# Patient Record
Sex: Male | Born: 1970 | Race: Black or African American | Hispanic: No | Marital: Single | Smoking: Never smoker
Health system: Southern US, Community
[De-identification: ages and names within clinical notes are randomized; demographics above are authoritative.]

## PROBLEM LIST (undated history)

## (undated) DIAGNOSIS — F419 Anxiety disorder, unspecified: Secondary | ICD-10-CM

## (undated) DIAGNOSIS — G4730 Sleep apnea, unspecified: Secondary | ICD-10-CM

## (undated) HISTORY — PX: NECK SURGERY: SHX720

---

## 2000-02-20 ENCOUNTER — Encounter: Payer: Self-pay | Admitting: Emergency Medicine

## 2000-02-20 ENCOUNTER — Emergency Department (HOSPITAL_COMMUNITY): Admission: EM | Admit: 2000-02-20 | Discharge: 2000-02-20 | Payer: Self-pay | Admitting: Emergency Medicine

## 2008-05-13 ENCOUNTER — Emergency Department (HOSPITAL_BASED_OUTPATIENT_CLINIC_OR_DEPARTMENT_OTHER): Admission: EM | Admit: 2008-05-13 | Discharge: 2008-05-13 | Payer: Self-pay | Admitting: Emergency Medicine

## 2015-03-06 ENCOUNTER — Ambulatory Visit (INDEPENDENT_AMBULATORY_CARE_PROVIDER_SITE_OTHER): Payer: PRIVATE HEALTH INSURANCE

## 2015-03-06 ENCOUNTER — Other Ambulatory Visit: Payer: Self-pay | Admitting: Neurological Surgery

## 2015-03-06 DIAGNOSIS — M50222 Other cervical disc displacement at C5-C6 level: Secondary | ICD-10-CM

## 2015-03-06 DIAGNOSIS — M50323 Other cervical disc degeneration at C6-C7 level: Secondary | ICD-10-CM

## 2015-03-06 DIAGNOSIS — M50322 Other cervical disc degeneration at C5-C6 level: Secondary | ICD-10-CM | POA: Diagnosis not present

## 2015-03-06 DIAGNOSIS — Z9889 Other specified postprocedural states: Secondary | ICD-10-CM

## 2016-09-04 ENCOUNTER — Emergency Department (HOSPITAL_BASED_OUTPATIENT_CLINIC_OR_DEPARTMENT_OTHER)
Admission: EM | Admit: 2016-09-04 | Discharge: 2016-09-04 | Disposition: A | Discharge status: Home / Self Care | Payer: PRIVATE HEALTH INSURANCE | Attending: Emergency Medicine | Admitting: Emergency Medicine

## 2016-09-04 ENCOUNTER — Encounter (HOSPITAL_BASED_OUTPATIENT_CLINIC_OR_DEPARTMENT_OTHER): Payer: Self-pay | Admitting: *Deleted

## 2016-09-04 DIAGNOSIS — R61 Generalized hyperhidrosis: Secondary | ICD-10-CM | POA: Diagnosis not present

## 2016-09-04 DIAGNOSIS — G5701 Lesion of sciatic nerve, right lower limb: Secondary | ICD-10-CM | POA: Insufficient documentation

## 2016-09-04 DIAGNOSIS — M79604 Pain in right leg: Secondary | ICD-10-CM

## 2016-09-04 MED ORDER — LIDOCAINE 5 % EX PTCH: 1.0000 | MEDICATED_PATCH | CUTANEOUS | 0 refills | Status: AC

## 2016-09-04 MED ORDER — IBUPROFEN 600 MG PO TABS: 600.0000 mg | ORAL_TABLET | Freq: Four times a day (QID) | ORAL | 0 refills | Status: AC | PRN

## 2016-09-04 MED ORDER — KETOROLAC TROMETHAMINE 15 MG/ML IJ SOLN
15.0000 mg | Freq: Once | INTRAMUSCULAR | Status: DC
  Filled 2016-09-04: qty 1

## 2016-09-04 MED ORDER — KETOROLAC TROMETHAMINE 15 MG/ML IJ SOLN
15.0000 mg | Freq: Once | INTRAMUSCULAR | Status: AC
  Administered 2016-09-04: 15 mg via INTRAMUSCULAR

## 2016-09-04 NOTE — ED Provider Notes (Signed)
[facility] Provider [facility]   CSN: [id number] Arrival date & time: 09/04/16  1808   History   Chief Complaint Chief Complaint  Patient presents with  . Leg Pain    HPI Aaron Richardson is a 46 y.o. male.  HPI  Patient presents to ED with pain to his right lateral leg for 2 days. Patient describes the pain as achy and radiates down his leg to the back of his knee. Also is having some associated pain to his gluteus area. Associated tingling and numbness that comes and goes. 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 Of note patient states that he has had a bruise for 3 months also located on his right side of his lateral leg. Denies any known injury. States the pain is worse at night. The pain is worse when he changes positions. Has tried heating pad, ibuprofen, and muscle ache patches without much relief.   History reviewed. No pertinent past medical history.  There are no active problems to display for this patient.   Past Surgical History:  Procedure Laterality Date  . NECK SURGERY      Home Medications    Prior to Admission medications   Not on File    Family History No family history on file.  Social History Social History  Substance Use Topics  . Smoking status: Never Smoker  . Smokeless tobacco: Never Used  . Alcohol use Yes    Allergies   Patient has no known allergies.   Review of Systems Review of Systems  Constitutional: Positive for diaphoresis and fever.  Respiratory: Negative for shortness of breath.   Cardiovascular: Negative for chest pain.  Gastrointestinal: Negative for abdominal pain.  Skin: Positive for color change.  Also per HPI  Physical Exam Updated Vital Signs BP 121/84   Pulse 90   Temp 99.8 F (37.7 C) (Oral)   Resp 20   Ht 5\' 11"  (1.803 m)   Wt 118.8 kg   SpO2 96%   BMI 36.54 kg/m   Physical Exam  Constitutional: He appears well-developed and well-nourished. No distress.  HENT:  Head: Normocephalic  and atraumatic.  Mouth/Throat: Oropharynx is clear and moist.  Eyes: Conjunctivae and EOM are normal. Pupils are equal, round, and reactive to light.  Neck: Normal range of motion. Neck supple.  Cardiovascular: Normal rate, regular rhythm and normal heart sounds.   Pulmonary/Chest: Effort normal and breath sounds normal.  Abdominal: Soft. There is no tenderness. There is no guarding.  Musculoskeletal:       Right upper leg: He exhibits no tenderness, no swelling and no deformity.       Legs: Straight leg raise negative on both sides. Hips normal. Tenderness to palpation over right piriformis that illicits symptoms.   Neurological: He is alert. He has normal strength. No cranial nerve deficit or sensory deficit. Gait normal.  Skin: Skin is warm and dry. No rash noted.   ED Treatments / Results  Labs (all labs ordered are listed, but only abnormal results are displayed) Labs Reviewed - No data to display  EKG  EKG Interpretation None       Radiology No results found.  Procedures Procedures (including critical care time)  Medications Ordered in ED Medications  ketorolac (TORADOL) 15 MG/ML injection 15 mg (15 mg Intramuscular Given 09/04/16 1959)    Initial Impression / Assessment and Plan / ED Course  I have reviewed the triage vital signs and the nursing notes.  Pertinent  labs & imaging results that were available during my care of the patient were reviewed by me and considered in my medical decision making (see chart for details).  Patient presented to ED for right lateral leg pain. Symptoms most consistent with piriformis syndrome versus IT band syndrome. Given IM Toradol injection in ED for pain. Handout given with piriformis rehabilitation exercises. Discussed the nature of the condition. Rx also given for ibuprofen. Neuro exam normal. No red flags on exam. To follow-up as outpatient with PCP.   Of note patient's wife later stated that patient has been complaining of  intermittent chest pain. Was going to work up further but patient declined. Shared decision making was had. Patient did not appear to be having active ACS as he did not have chest pain currently. Patient opted not to have further workup at this time. Discussed the risks benefits of doing so.  Final Clinical Impressions(s) / ED Diagnoses   Final diagnoses:  Piriformis syndrome of right side  Right leg pain    New Prescriptions New Prescriptions   No medications on file     Pincus LargeJazma Y Blayden Conwell, DO 09/04/16 2320    Canary Brimhristopher J Tegeler, MD 09/05/16 [phone]

## 2016-09-04 NOTE — ED Triage Notes (Signed)
Pain to his right lateral upper leg x 2 days. He has noticed a bruise x 3 months in the same area. No known injury.

## 2016-09-04 NOTE — Discharge Instructions (Signed)
Follow-up with PCP for ongoing pain Discussed chest discomfort and you wanted avoid work up at this time. Please follow-up if chest pain worsens

## 2016-09-04 NOTE — ED Notes (Signed)
Pt verbalizes understanding of d/c instructions and denies any further needs at this time. 

## 2020-11-03 ENCOUNTER — Emergency Department (HOSPITAL_BASED_OUTPATIENT_CLINIC_OR_DEPARTMENT_OTHER)
Admission: EM | Admit: 2020-11-03 | Discharge: 2020-11-03 | Disposition: A | Discharge status: Home / Self Care | Payer: PRIVATE HEALTH INSURANCE | Attending: Emergency Medicine | Admitting: Emergency Medicine

## 2020-11-03 ENCOUNTER — Encounter (HOSPITAL_BASED_OUTPATIENT_CLINIC_OR_DEPARTMENT_OTHER): Payer: Self-pay

## 2020-11-03 ENCOUNTER — Other Ambulatory Visit: Payer: Self-pay

## 2020-11-03 ENCOUNTER — Emergency Department (HOSPITAL_BASED_OUTPATIENT_CLINIC_OR_DEPARTMENT_OTHER): Payer: PRIVATE HEALTH INSURANCE

## 2020-11-03 DIAGNOSIS — R42 Dizziness and giddiness: Secondary | ICD-10-CM | POA: Diagnosis not present

## 2020-11-03 DIAGNOSIS — R072 Precordial pain: Secondary | ICD-10-CM | POA: Insufficient documentation

## 2020-11-03 DIAGNOSIS — R079 Chest pain, unspecified: Secondary | ICD-10-CM

## 2020-11-03 LAB — CBC WITH DIFFERENTIAL/PLATELET
Abs Immature Granulocytes: 0.02 10*3/uL (ref 0.00–0.07)
Basophils Absolute: 0 10*3/uL (ref 0.0–0.1)
Basophils Relative: 1 %
Eosinophils Absolute: 0.1 10*3/uL (ref 0.0–0.5)
Eosinophils Relative: 2 %
HCT: 41.2 % (ref 39.0–52.0)
Hemoglobin: 14.9 g/dL (ref 13.0–17.0)
Immature Granulocytes: 0 %
Lymphocytes Relative: 44 %
Lymphs Abs: 2.9 10*3/uL (ref 0.7–4.0)
MCH: 30.3 pg (ref 26.0–34.0)
MCHC: 36.2 g/dL — ABNORMAL HIGH (ref 30.0–36.0)
MCV: 83.9 fL (ref 80.0–100.0)
Monocytes Absolute: 0.6 10*3/uL (ref 0.1–1.0)
Monocytes Relative: 9 %
Neutro Abs: 3 10*3/uL (ref 1.7–7.7)
Neutrophils Relative %: 44 %
Platelets: 200 10*3/uL (ref 150–400)
RBC: 4.91 MIL/uL (ref 4.22–5.81)
RDW: 13.6 % (ref 11.5–15.5)
WBC: 6.7 10*3/uL (ref 4.0–10.5)
nRBC: 0 % (ref 0.0–0.2)

## 2020-11-03 LAB — COMPREHENSIVE METABOLIC PANEL
ALT: 35 U/L (ref 0–44)
AST: 33 U/L (ref 15–41)
Albumin: 4.2 g/dL (ref 3.5–5.0)
Alkaline Phosphatase: 65 U/L (ref 38–126)
Anion gap: 9 (ref 5–15)
BUN: 16 mg/dL (ref 6–20)
CO2: 26 mmol/L (ref 22–32)
Calcium: 8.9 mg/dL (ref 8.9–10.3)
Chloride: 102 mmol/L (ref 98–111)
Creatinine, Ser: 1.13 mg/dL (ref 0.61–1.24)
GFR, Estimated: >60 mL/min (ref >60)
Glucose, Bld: 110 mg/dL — ABNORMAL HIGH (ref 70–99)
Potassium: 4 mmol/L (ref 3.5–5.1)
Sodium: 137 mmol/L (ref 135–145)
Total Bilirubin: 0.6 mg/dL (ref 0.3–1.2)
Total Protein: 7.8 g/dL (ref 6.5–8.1)

## 2020-11-03 LAB — TROPONIN I (HIGH SENSITIVITY): Troponin I (High Sensitivity): <2 ng/L (ref <18)

## 2020-11-03 MED ORDER — ACETAMINOPHEN 500 MG PO TABS
1000.0000 mg | ORAL_TABLET | Freq: Once | ORAL | Status: AC
  Administered 2020-11-03: 1000 mg via ORAL
  Filled 2020-11-03: qty 2

## 2020-11-03 NOTE — Discharge Instructions (Addendum)
All the blood test including heart markers were normal today.  Your EKG and Chest x-ray were normal.  You can take tylenol or ibuprofen as needed for the pain.  If you develop passing out, shortness of breath or new swelling in the legs return to the ER.

## 2020-11-03 NOTE — ED Triage Notes (Signed)
Chest pain since 0430 this am, pain is now only with movement into certain positions.

## 2020-11-03 NOTE — ED Provider Notes (Signed)
[facility] EMERGENCY DEPARTMENT Provider Note   CSN: [id number] Arrival date & time: 11/03/20  3646     History Chief Complaint  Patient presents with   Chest Pain    Aaron Richardson is a 50 y.o. male.  The history is provided by the patient and the spouse.  Chest Pain Pain location:  Substernal area Pain quality: sharp and tightness   Pain radiates to:  Does not radiate Pain severity:  Moderate Onset quality:  Sudden Duration:  4 hours Timing:  Constant Progression:  Improving Chronicity:  New Context comment:  Woke him from sleep.  went to bed feeling fine Relieved by: better with lying down. Worsened by:  Certain positions (worse if sitting up or lying on the right side) Ineffective treatments:  Certain positions and aspirin Associated symptoms: dizziness   Associated symptoms: no abdominal pain, no back pain, no fever, no shortness of breath and no vomiting   Associated symptoms comment:  Just didn't feel right and did have 1 episode of diarrhea but reports that has not been unusual as he has had GI problems and is scheduled for a colonoscopy.  Rarely has GERD.  No recent cough, fever and no SOB today.  No prior hx of having similar pain.  No radiation into the back or abd.  No recent immobilization or surgery.  No unilateral leg pain or swelling.  Pain is improving and doesn't feel it while lying down but is reproduced by sitting up.  No pleuritic pain. No tobacco, drug or alcohol use.  Started back on his wellbutrin last night for the first time in 2 months but does not take any other medication.  Paramedics came to his house this morning and EKG with NSR but they came to the ER to be checked out. Risk factors: male sex   Risk factors: no coronary artery disease, no diabetes mellitus, no high cholesterol, no hypertension, no immobilization, no prior DVT/PE and no smoking   Risk factors comment:  Mother with a mild heart attack in her mid 35's.  grandfather with  MI in his 39's     No past medical history on file.  There are no problems to display for this patient.   Past Surgical History:  Procedure Laterality Date   NECK SURGERY         No family history on file.  Social History   Tobacco Use   Smoking status: Never   Smokeless tobacco: Never  Substance Use Topics   Alcohol use: Yes   Drug use: No    Home Medications Prior to Admission medications   Medication Sig Start Date End Date Taking? Authorizing Provider  ibuprofen (ADVIL,MOTRIN) 600 MG tablet Take 1 tablet (600 mg total) by mouth every 6 (six) hours as needed for moderate pain. 09/04/16   Pincus Large, DO  lidocaine (LIDODERM) 5 % Place 1 patch onto the skin daily. Remove & Discard patch within 12 hours or as directed by MD 09/04/16   Pincus Large, DO    Allergies    Patient has no known allergies.  Review of Systems   Review of Systems  Constitutional:  Negative for fever.  Respiratory:  Negative for shortness of breath.   Cardiovascular:  Positive for chest pain.  Gastrointestinal:  Negative for abdominal pain and vomiting.  Musculoskeletal:  Negative for back pain.  Neurological:  Positive for dizziness.  All other systems reviewed and are negative.  Physical Exam Updated Vital Signs BP Marland Kitchen)  133/101 (BP Location: Right Arm)   Pulse 65   Temp 98 F (36.7 C) (Oral)   Resp 18   SpO2 99%   Physical Exam Vitals and nursing note reviewed.  Constitutional:      General: He is not in acute distress.    Appearance: Normal appearance. He is well-developed.  HENT:     Head: Normocephalic and atraumatic.     Mouth/Throat:     Mouth: Mucous membranes are moist.  Eyes:     Conjunctiva/sclera: Conjunctivae normal.     Pupils: Pupils are equal, round, and reactive to light.  Cardiovascular:     Rate and Rhythm: Normal rate and regular rhythm.     Pulses: Normal pulses.     Heart sounds: No murmur heard. Pulmonary:     Effort: Pulmonary effort is normal.  No respiratory distress.     Breath sounds: Normal breath sounds. No wheezing or rales.  Abdominal:     General: There is no distension.     Palpations: Abdomen is soft.     Tenderness: There is no abdominal tenderness. There is no guarding or rebound.  Musculoskeletal:        General: No tenderness. Normal range of motion.     Cervical back: Normal range of motion and neck supple.     Right lower leg: No edema.     Left lower leg: No edema.  Skin:    General: Skin is warm and dry.     Findings: No erythema or rash.  Neurological:     Mental Status: He is alert and oriented to person, place, and time. Mental status is at baseline.  Psychiatric:        Mood and Affect: Mood normal.        Behavior: Behavior normal.    ED Results / Procedures / Treatments   Labs (all labs ordered are listed, but only abnormal results are displayed) Labs Reviewed  CBC WITH DIFFERENTIAL/PLATELET  COMPREHENSIVE METABOLIC PANEL  TROPONIN I (HIGH SENSITIVITY)    EKG None  Radiology No results found.  Procedures Procedures   Medications Ordered in ED Medications - No data to display  ED Course  I have reviewed the triage vital signs and the nursing notes.  Pertinent labs & imaging results that were available during my care of the patient were reviewed by me and considered in my medical decision making (see chart for details).    MDM Rules/Calculators/A&P                          Patient is a 50 year old male presenting with nonspecific chest pain that woke him from sleep around 4 AM this morning.  Patient reports that he just has not felt good and was thinking it would go away but has persisted.  It does seem to be worse with sitting up and laying on his right side.  It is not made worse with deep breaths or exertion.  When he did get up he did feel little dizzy and just did not feel himself.  He had 1 episode of diarrhea at home this morning but reports that has been more of an ongoing  issue and he saw his doctor for this last week and is planning on a colonoscopy in the future.  He has never had chest pain before.  He cannot recall any trauma.  PERC negative.  Heart score of 2.  EKG is within normal limits today.  The pain does not radiate into his back, arms or abdomen.  Low suspicion for dissection at this time.  Possibly musculoskeletal in nature.  Lower suspicion for ACS.  No EKG findings for pericarditis and low suspicion for pericardial effusion or myocarditis.  No respiratory symptoms to suggest pneumonia or COVID.  Will check enzymes, chest x-ray renal function and hemoglobin.  We will continue to monitor.  8:30 AM Labs are all within normal limits.  Chest x-ray without acute findings.  Troponin is normal.  On repeat evaluation patient's blood pressure is now normal at 113/83.  He is well-appearing.  Chest pain is most likely musculoskeletal in nature.  Findings discussed with the patient and his wife.  Since patient's pain had been present for approximately 4 hours before initial blood draw I do not feel that he needs a second troponin.  Will discharge home for outpatient follow-up and patient and wife given return precautions.  MDM   Amount and/or Complexity of Data Reviewed Clinical lab tests: ordered and reviewed Tests in the radiology section of CPT: ordered and reviewed Tests in the medicine section of CPT: reviewed and ordered Discuss the patient with other providers: yes Independent visualization of images, tracings, or specimens: yes  Risk of Complications, Morbidity, and/or Mortality Presenting problems: moderate Diagnostic procedures: low Management options: low  Patient Progress Patient progress: stable    Final Clinical Impression(s) / ED Diagnoses Final diagnoses:  Nonspecific chest pain    Rx / DC Orders ED Discharge Orders     None        Gwyneth Sprout, MD 11/03/20 [phone]

## 2021-07-16 ENCOUNTER — Emergency Department (HOSPITAL_BASED_OUTPATIENT_CLINIC_OR_DEPARTMENT_OTHER)
Admission: EM | Admit: 2021-07-16 | Discharge: 2021-07-16 | Disposition: A | Discharge status: Home / Self Care | Payer: PRIVATE HEALTH INSURANCE | Attending: Emergency Medicine | Admitting: Emergency Medicine

## 2021-07-16 ENCOUNTER — Other Ambulatory Visit: Payer: Self-pay

## 2021-07-16 ENCOUNTER — Encounter (HOSPITAL_BASED_OUTPATIENT_CLINIC_OR_DEPARTMENT_OTHER): Payer: Self-pay | Admitting: Emergency Medicine

## 2021-07-16 ENCOUNTER — Emergency Department (HOSPITAL_BASED_OUTPATIENT_CLINIC_OR_DEPARTMENT_OTHER): Payer: PRIVATE HEALTH INSURANCE

## 2021-07-16 DIAGNOSIS — R739 Hyperglycemia, unspecified: Secondary | ICD-10-CM | POA: Diagnosis not present

## 2021-07-16 DIAGNOSIS — R42 Dizziness and giddiness: Secondary | ICD-10-CM | POA: Insufficient documentation

## 2021-07-16 DIAGNOSIS — R040 Epistaxis: Secondary | ICD-10-CM | POA: Diagnosis not present

## 2021-07-16 HISTORY — DX: Anxiety disorder, unspecified: F41.9

## 2021-07-16 HISTORY — DX: Sleep apnea, unspecified: G47.30

## 2021-07-16 LAB — CBC WITH DIFFERENTIAL/PLATELET
Abs Immature Granulocytes: 0.02 10*3/uL (ref 0.00–0.07)
Basophils Absolute: 0 10*3/uL (ref 0.0–0.1)
Basophils Relative: 1 %
Eosinophils Absolute: 0.1 10*3/uL (ref 0.0–0.5)
Eosinophils Relative: 2 %
HCT: 40.4 % (ref 39.0–52.0)
Hemoglobin: 14.7 g/dL (ref 13.0–17.0)
Immature Granulocytes: 0 %
Lymphocytes Relative: 44 %
Lymphs Abs: 2.2 10*3/uL (ref 0.7–4.0)
MCH: 30.1 pg (ref 26.0–34.0)
MCHC: 36.4 g/dL — ABNORMAL HIGH (ref 30.0–36.0)
MCV: 82.8 fL (ref 80.0–100.0)
Monocytes Absolute: 0.4 10*3/uL (ref 0.1–1.0)
Monocytes Relative: 7 %
Neutro Abs: 2.4 10*3/uL (ref 1.7–7.7)
Neutrophils Relative %: 46 %
Platelets: 221 10*3/uL (ref 150–400)
RBC: 4.88 MIL/uL (ref 4.22–5.81)
RDW: 13.2 % (ref 11.5–15.5)
WBC: 5.1 10*3/uL (ref 4.0–10.5)
nRBC: 0 % (ref 0.0–0.2)

## 2021-07-16 LAB — BASIC METABOLIC PANEL
Anion gap: 10 (ref 5–15)
BUN: 17 mg/dL (ref 6–20)
CO2: 23 mmol/L (ref 22–32)
Calcium: 8.7 mg/dL — ABNORMAL LOW (ref 8.9–10.3)
Chloride: 104 mmol/L (ref 98–111)
Creatinine, Ser: 1.02 mg/dL (ref 0.61–1.24)
GFR, Estimated: >60 mL/min (ref >60)
Glucose, Bld: 121 mg/dL — ABNORMAL HIGH (ref 70–99)
Potassium: 3.7 mmol/L (ref 3.5–5.1)
Sodium: 137 mmol/L (ref 135–145)

## 2021-07-16 LAB — CBG MONITORING, ED: Glucose-Capillary: 107 mg/dL — ABNORMAL HIGH (ref 70–99)

## 2021-07-16 MED ORDER — SODIUM CHLORIDE 0.9 % IV BOLUS
1000.0000 mL | Freq: Once | INTRAVENOUS | Status: AC
  Administered 2021-07-16: 1000 mL via INTRAVENOUS

## 2021-07-16 NOTE — Discharge Instructions (Addendum)
You see blood pressure log attached to these papers to keep track of your blood pressures before you see your primary care provider. ? ?Follow-up with your psychiatrist about the potential side effects of BuSpar and discuss a change in medications if you see fit. ?

## 2021-07-16 NOTE — ED Triage Notes (Addendum)
Pt states he woke up at 5 am and felt fine.  He started having dizziness and tingling all over at 7:30 this am.  Pt also c/o dry mouth, and not feeling right.  No weakness, no slurred speech, no abnormal gait, no blurry vision.  Pt started buspar yesterday and took xanax this am. ?

## 2021-07-16 NOTE — ED Provider Notes (Signed)
?[facility] EMERGENCY DEPARTMENT ?Provider Note ? ? ?CSN: [id number] ?Arrival date & time: 07/16/21  0846 ? ?  ? ?History ? ?Chief Complaint  ?Patient presents with  ? Dizziness  ? ? ?Aaron Richardson is a 51 y.o. male with a past medical history of anxiety presenting today with complaint of lightheadedness.  He reports that he started using a CPAP machine 3 days ago and started BuSpar yesterday for anxiety.  He did not have any complications until this morning when he was getting ready for work.  He says that he woke up this morning around 5 with a nosebleed from his CPAP.  This has been happening every morning since he began due to his nose getting dry.  He then watched TV and got ready for work.  When he bent down to pick up his toes from the oven he felt lightheaded and thought he may pass out.  This feeling lasted for about 40 minutes and then went away.  At that time the patient's wife who is an Char[employer] fundraiser checked his blood sugar and blood pressure.  She reports his blood pressure was in the 200s systolic and blood sugar was normal.  Reports feeling totally normal now. ? ? ?Home Medications ?Prior to Admission medications   ?Medication Sig Start Date End Date Taking? Authorizing Provider  ?ibuprofen (ADVIL,MOTRIN) 600 MG tablet Take 1 tablet (600 mg total) by mouth every 6 (six) hours as needed for moderate pain. 09/04/16   Pincus Large, DO  ?lidocaine (LIDODERM) 5 % Place 1 patch onto the skin daily. Remove & Discard patch within 12 hours or as directed by MD 09/04/16   Pincus Large, DO  ?   ? ?Allergies    ?Patient has no known allergies.   ? ?Review of Systems   ?Review of Systems  ?Eyes:  Negative for photophobia and visual disturbance.  ?Neurological:  Positive for dizziness and light-headedness. Negative for syncope and headaches.  ?Psychiatric/Behavioral:  Negative for confusion.   ? ?Physical Exam ?Updated Vital Signs ?BP (!) 141/101 (BP Location: Left Arm)   Pulse 97   Temp 97.7 ?F (36.5 ?C)  (Oral)   Resp 18   Ht 5\' 11"  (1.803 m)   Wt 119.3 kg   SpO2 100%   BMI 36.68 kg/m?  ?Physical Exam ?Vitals and nursing note reviewed.  ?Constitutional:   ?   General: He is not in acute distress. ?   Appearance: Normal appearance. He is not ill-appearing.  ?HENT:  ?   Head: Normocephalic and atraumatic.  ?Eyes:  ?   General: No scleral icterus. ?   Extraocular Movements: Extraocular movements intact.  ?   Conjunctiva/sclera: Conjunctivae normal.  ?   Pupils: Pupils are equal, round, and reactive to light.  ?Cardiovascular:  ?   Rate and Rhythm: Normal rate and regular rhythm.  ?Pulmonary:  ?   Effort: Pulmonary effort is normal. No respiratory distress.  ?   Breath sounds: No wheezing.  ?Skin: ?   General: Skin is warm and dry.  ?   Findings: No rash.  ?Neurological:  ?   Mental Status: He is alert and oriented to person, place, and time.  ?   Cranial Nerves: No cranial nerve deficit.  ?   Sensory: No sensory deficit.  ?   Motor: No weakness.  ?   Gait: Gait normal.  ?Psychiatric:     ?   Mood and Affect: Mood normal.     ?  Behavior: Behavior normal.  ? ? ?ED Results / Procedures / Treatments   ?Labs ?(all labs ordered are listed, but only abnormal results are displayed) ?Labs Reviewed  ?CBC WITH DIFFERENTIAL/PLATELET - Abnormal; Notable for the following components:  ?    Result Value  ? MCHC 36.4 (*)   ? All other components within normal limits  ?BASIC METABOLIC PANEL - Abnormal; Notable for the following components:  ? Glucose, Bld 121 (*)   ? Calcium 8.7 (*)   ? All other components within normal limits  ?CBG MONITORING, ED - Abnormal; Notable for the following components:  ? Glucose-Capillary 107 (*)   ? All other components within normal limits  ? ? ?EKG ?EKG Interpretation ? ?Date/Time:  Tuesday July 16 2021 09:05:10 EDT ?Ventricular Rate:  68 ?PR Interval:  155 ?QRS Duration: 91 ?QT Interval:  386 ?QTC Calculation: 411 ?R Axis:   46 ?Text Interpretation: Sinus rhythm No significant change since last  tracing Confirmed by Jacalyn Lefevre [id number]) on 07/16/2021 9:07:34 AM ? ?Radiology ?DG Chest Portable 1 View ? ?Result Date: 07/16/2021 ?CLINICAL DATA:  Shortness of breath EXAM: PORTABLE CHEST 1 VIEW COMPARISON:  11/03/2020 FINDINGS: The heart size and mediastinal contours are within normal limits. Both lungs are clear. The visualized skeletal structures are unremarkable. IMPRESSION: No active disease. Electronically Signed   By: Elige Ko M.D.   On: 07/16/2021 09:46   ? ?Procedures ?Procedures  ?Normal sinus rhythm with a normal rate ? ?Medications Ordered in ED ?Medications  ?sodium chloride 0.9 % bolus 1,000 mL (has no administration in time range)  ? ? ?ED Course/ Medical Decision Making/ A&P ?  ?                        ?Medical Decision Making ?Amount and/or Complexity of Data Reviewed ?Labs: ordered. ?Radiology: ordered. ? ?Patient presents to the ED for concern of dizziness. Differential includes but is not limited to ACS, cardiac arrhythmia, intracranial hemorrhage or mass, CVA, vertigo, dehydration, migraine, orthostatic hypotension and drug reactions. ? ? ?Per internal/external chart review: I reviewed patient's notes from behavioral health with [facility].  On the seventh, the patient saw his psychiatrist who ultimately decided to add BuSpar due to his continued anxiety on Zoloft.  Per her note, patient was cautioned on nausea and dizziness being the major to side effects and that these symptoms go away with time. ? ? ?I performed a full physical exam, pertinent findings include: ?No pertinent findings ? ?Diagnostics: ? ?I ordered and viewed labs.  There are no pertinent results. ? ?Per wife's request due to family history of lung malignancy, I ordered and individually viewed patient's chest x-ray.  Imaging was negative.  ? ? ?Cardiac Monitoring: ? ?The patient was maintained on a cardiac monitor.  I personally viewed and interpreted the cardiac monitored which showed normal sinus rhythm with a  normal rate ? ?Test Considered: CT head.  Patient's neurologic exam was completely normal, doubt intracranial abnormality. ? ? ?Treatment: ? ?I ordered IV fluids and when I reevaluated the patient he reported that he was feeling better ? ? ?MDM/Disposition: ? ?I reevaluated the patient and found that they have : Remained asymptomatic.  Admission was considered but not indicated at this time.  I believe patient would benefit from follow-up with his primary care provider about potential hypertension as well discussions with his psychiatrist about the side effects of BuSpar. ? ? ?Final Clinical Impression(s) / ED Diagnoses ?Final  diagnoses:  ?Lightheadedness  ? ? ?Rx / DC Orders ? ? ?Results and diagnoses were explained to the patient. Return precautions discussed in full. Patient had no additional questions and expressed complete understanding. ? ? ?This chart was dictated using voice recognition software.  Despite best efforts to proofread,  errors can occur which can change the documentation meaning.  ?  ?Saddie BendersRedwine, Dorothe Elmore A, PA-C ?07/16/21 1032 ? ?  ?Jacalyn LefevreHaviland, Julie, MD ?07/16/21 1623 ? ?

## 2022-04-22 ENCOUNTER — Encounter (HOSPITAL_BASED_OUTPATIENT_CLINIC_OR_DEPARTMENT_OTHER): Payer: Self-pay

## 2022-04-22 ENCOUNTER — Emergency Department (HOSPITAL_BASED_OUTPATIENT_CLINIC_OR_DEPARTMENT_OTHER): Payer: PRIVATE HEALTH INSURANCE

## 2022-04-22 ENCOUNTER — Emergency Department (HOSPITAL_BASED_OUTPATIENT_CLINIC_OR_DEPARTMENT_OTHER)
Admission: EM | Admit: 2022-04-22 | Discharge: 2022-04-22 | Disposition: A | Discharge status: Home / Self Care | Payer: PRIVATE HEALTH INSURANCE | Attending: Emergency Medicine | Admitting: Emergency Medicine

## 2022-04-22 DIAGNOSIS — R079 Chest pain, unspecified: Secondary | ICD-10-CM | POA: Insufficient documentation

## 2022-04-22 LAB — CBC
HCT: 41.4 % (ref 39.0–52.0)
Hemoglobin: 14.7 g/dL (ref 13.0–17.0)
MCH: 29.6 pg (ref 26.0–34.0)
MCHC: 35.5 g/dL (ref 30.0–36.0)
MCV: 83.3 fL (ref 80.0–100.0)
Platelets: 244 10*3/uL (ref 150–400)
RBC: 4.97 MIL/uL (ref 4.22–5.81)
RDW: 14.2 % (ref 11.5–15.5)
WBC: 6.4 10*3/uL (ref 4.0–10.5)
nRBC: 0 % (ref 0.0–0.2)

## 2022-04-22 LAB — BASIC METABOLIC PANEL
Anion gap: 9 (ref 5–15)
BUN: 13 mg/dL (ref 6–20)
CO2: 26 mmol/L (ref 22–32)
Calcium: 9.1 mg/dL (ref 8.9–10.3)
Chloride: 103 mmol/L (ref 98–111)
Creatinine, Ser: 1.02 mg/dL (ref 0.61–1.24)
GFR, Estimated: >60 mL/min (ref >60)
Glucose, Bld: 132 mg/dL — ABNORMAL HIGH (ref 70–99)
Potassium: 3.7 mmol/L (ref 3.5–5.1)
Sodium: 138 mmol/L (ref 135–145)

## 2022-04-22 LAB — TROPONIN I (HIGH SENSITIVITY)
Troponin I (High Sensitivity): <2 ng/L (ref <18)
Troponin I (High Sensitivity): <2 ng/L (ref <18)

## 2022-04-22 MED ORDER — AMLODIPINE BESYLATE 5 MG PO TABS: 2.5000 mg | ORAL_TABLET | Freq: Once | ORAL | Status: DC

## 2022-04-22 NOTE — Discharge Instructions (Signed)
Please follow-up with your primary care doctor, take your blood pressure daily twice a day, and make a list for your primary care doctor, and discuss possibly getting a blood pressure medication with them.  If you start having severe chest pain, shortness of breath please return to the ER.

## 2022-04-22 NOTE — ED Provider Notes (Signed)
[facility] EMERGENCY DEPARTMENT Provider Note   CSN: [id number] Arrival date & time: 04/22/22  [id number]     History  Chief Complaint  Patient presents with   Chest Pain    Aaron Richardson is a 51 y.o. male, history of sleep apnea, who presents to the ED secondary to chest discomfort that was sharp and stabbing that occurred earlier this a.m. him.  Aaron Richardson states that the pain was sharp and stabbing, last for about 30 minutes, and then dissipated.  Aaron Richardson states Aaron Richardson has slight achiness in his chest otherwise doing well.  Denies any shortness of breath, cough, fever, chills, nausea, vomiting.  No radiation of the pain.  Has not had any recent surgeries.  Does state his blood pressure is a little bit elevated does not take any blood pressure medication.  Currently denies no chest pain     Home Medications Prior to Admission medications   Medication Sig Start Date End Date Taking? Authorizing Provider  ibuprofen (ADVIL,MOTRIN) 600 MG tablet Take 1 tablet (600 mg total) by mouth every 6 (six) hours as needed for moderate pain. 09/04/16   Katheren Shams, DO  lidocaine (LIDODERM) 5 % Place 1 patch onto the skin daily. Remove & Discard patch within 12 hours or as directed by MD 09/04/16   Katheren Shams, DO      Allergies    Patient has no known allergies.    Review of Systems   Review of Systems  Respiratory:  Negative for cough and shortness of breath.   Cardiovascular:  Positive for chest pain.  Gastrointestinal:  Negative for nausea.    Physical Exam Updated Vital Signs BP 118/70   Pulse 71   Temp 97.8 F (36.6 C) (Oral)   Resp 14   Ht 5\' 11"  (1.803 m)   Wt 120.2 kg   SpO2 97%   BMI 36.96 kg/m  Physical Exam Vitals and nursing note reviewed.  Constitutional:      General: Aaron Richardson is not in acute distress.    Appearance: Aaron Richardson is well-developed.  HENT:     Head: Normocephalic and atraumatic.  Eyes:     Conjunctiva/sclera: Conjunctivae normal.  Cardiovascular:     Rate and  Rhythm: Normal rate and regular rhythm.     Heart sounds: No murmur heard. Pulmonary:     Effort: Pulmonary effort is normal. No respiratory distress.     Breath sounds: Normal breath sounds.  Abdominal:     Palpations: Abdomen is soft.     Tenderness: There is no abdominal tenderness.  Musculoskeletal:        General: No swelling.     Cervical back: Neck supple.  Skin:    General: Skin is warm and dry.     Capillary Refill: Capillary refill takes less than 2 seconds.  Neurological:     Mental Status: Aaron Richardson is alert.  Psychiatric:        Mood and Affect: Mood normal.     ED Results / Procedures / Treatments   Labs (all labs ordered are listed, but only abnormal results are displayed) Labs Reviewed  BASIC METABOLIC PANEL - Abnormal; Notable for the following components:      Result Value   Glucose, Bld 132 (*)    All other components within normal limits  CBC  TROPONIN I (HIGH SENSITIVITY)  TROPONIN I (HIGH SENSITIVITY)    EKG EKG Interpretation  Date/Time:  Tuesday April 22 2022 09:04:28 EST Ventricular Rate:  98 PR  Interval:  144 QRS Duration: 88 QT Interval:  342 QTC Calculation: 436 R Axis:   44 Text Interpretation: Normal sinus rhythm Normal ECG When compared with ECG of 16-Jul-2021 09:05, PREVIOUS ECG IS PRESENT Confirmed by Gwyneth Sprout ([id number]) on 04/22/2022 11:13:48 AM  Radiology DG Chest 2 View  Result Date: 04/22/2022 CLINICAL DATA:  Provided history: Chest pain. EXAM: CHEST - 2 VIEW COMPARISON:  Prior chest radiographs 07/16/2021 and earlier. FINDINGS: Heart size within normal limits. No appreciable airspace consolidation or pulmonary edema. No evidence of pleural effusion or pneumothorax. No acute bony abnormality identified. Prior C4-C5 disc replacement. IMPRESSION: No evidence of acute cardiopulmonary abnormality. Electronically Signed   By: Jackey Loge D.O.   On: 04/22/2022 09:33    Procedures Procedures    Medications Ordered in  ED Medications - No data to display  ED Course/ Medical Decision Making/ A&P                           Medical Decision Making Patient is a 51 year old male, here for chest discomfort that lasted for about 30 minutes that has now resolved.  Aaron Richardson has no tenderness to palpation of his chest, his EKG was normal sinus rhythm, and Aaron Richardson is lung sounds are clear.  We will obtain troponins, CBC, BMP, for further evaluation.  Aaron Richardson is well-appearing.  Labs ordered by triage.  Amount and/or Complexity of Data Reviewed Labs: ordered.    Details: Labs unremarkable with troponins less than 2 x 2 Radiology: ordered.    Details: Chest x-ray clear. Discussion of management or test interpretation with external provider(s): Heart score of 2, discussed with patient, troponins less than 2, recommend follow-up with primary care doctor, and keeping track of blood pressure, and talking to his PCP about further blood pressure control.  Return precautions emphasized, Aaron Richardson is well-appearing.    Final Clinical Impression(s) / ED Diagnoses Final diagnoses:  Chest pain, unspecified type    Rx / DC Orders ED Discharge Orders     None         Hari Casaus, [place]ley [place], PA 04/22/22 1239    Gwyneth Sprout, MD 04/22/22 1501

## 2022-04-22 NOTE — ED Triage Notes (Signed)
C/o sharp chest pain that went across chest this morning, continues to have pain to left chest.

## 2024-01-19 IMAGING — DX DG CHEST 1V PORT
1 series · 1 of 1 positions shown · non-contrast
Comparison: 11/03/2020

CLINICAL DATA: Shortness of breath

EXAM:
PORTABLE CHEST 1 VIEW

[chest ap]
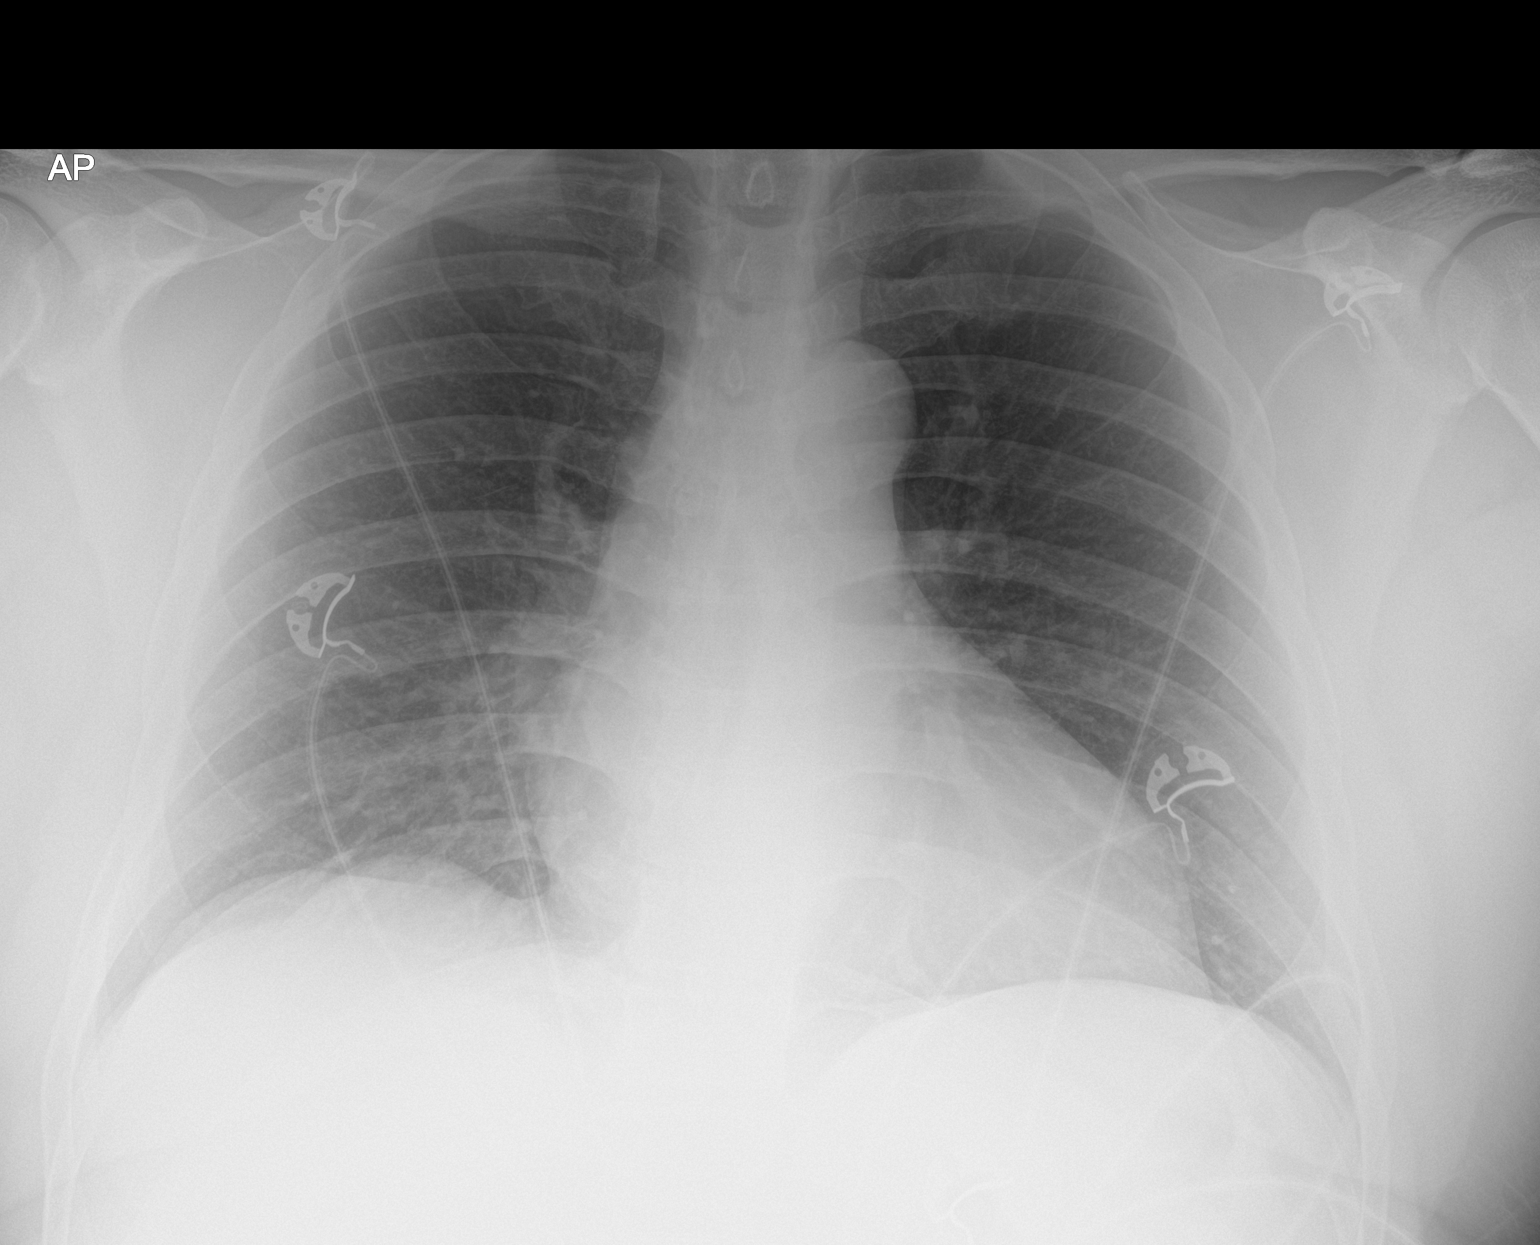

[1 of 1 positions shown; findings below may reference images not displayed]

FINDINGS: The heart size and mediastinal contours are within normal limits.
Both lungs are clear. The visualized skeletal structures are
unremarkable.
IMPRESSION: No active disease.
